# Patient Record
Sex: Male | Born: 2000 | Race: Black or African American | Hispanic: No | Marital: Single | Smoking: Never smoker
Health system: Southern US, Community
[De-identification: ages and names within clinical notes are randomized; demographics above are authoritative.]

## PROBLEM LIST (undated history)

## (undated) DIAGNOSIS — R947 Abnormal results of other endocrine function studies: Secondary | ICD-10-CM

---
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
## 2023-01-02 ENCOUNTER — Encounter (HOSPITAL_COMMUNITY): Payer: Self-pay | Admitting: *Deleted

## 2023-01-02 ENCOUNTER — Emergency Department (HOSPITAL_COMMUNITY)
Admission: EM | Admit: 2023-01-02 | Discharge: 2023-01-02 | Disposition: A | Discharge status: Home / Self Care | Payer: Medicaid Other | Attending: Emergency Medicine | Admitting: Emergency Medicine

## 2023-01-02 ENCOUNTER — Emergency Department (HOSPITAL_COMMUNITY): Payer: Medicaid Other

## 2023-01-02 ENCOUNTER — Other Ambulatory Visit: Payer: Self-pay

## 2023-01-02 DIAGNOSIS — Y92410 Unspecified street and highway as the place of occurrence of the external cause: Secondary | ICD-10-CM | POA: Insufficient documentation

## 2023-01-02 DIAGNOSIS — M25511 Pain in right shoulder: Secondary | ICD-10-CM | POA: Diagnosis not present

## 2023-01-02 DIAGNOSIS — M545 Low back pain, unspecified: Secondary | ICD-10-CM | POA: Diagnosis present

## 2023-01-02 DIAGNOSIS — M25561 Pain in right knee: Secondary | ICD-10-CM | POA: Insufficient documentation

## 2023-01-02 DIAGNOSIS — M25562 Pain in left knee: Secondary | ICD-10-CM | POA: Insufficient documentation

## 2023-01-02 HISTORY — DX: Abnormal results of other endocrine function studies: R94.7

## 2023-01-02 MED ORDER — METHOCARBAMOL 500 MG PO TABS
500.0000 mg | ORAL_TABLET | Freq: Once | ORAL | Status: AC
Start: 2023-01-02 — End: 2023-01-02
  Administered 2023-01-02: 500 mg via ORAL
  Filled 2023-01-02: qty 1

## 2023-01-02 MED ORDER — KETOROLAC TROMETHAMINE 15 MG/ML IJ SOLN
15.0000 mg | Freq: Once | INTRAMUSCULAR | Status: AC
  Administered 2023-01-02: 15 mg via INTRAVENOUS
  Filled 2023-01-02: qty 1
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
## 2023-01-02 NOTE — Discharge Instructions (Signed)
Your x-ray imaging studies were reassuring.  You will likely have worsened soreness tomorrow.  Take ibuprofen and Tylenol as needed.  Return to the emergency department for any new or worsening symptoms of concern.

## 2023-01-02 NOTE — ED Notes (Signed)
THE PT WENT TO TRIAGE BEFORE I HAD A CHANCE TO TRIAGE HIM

## 2023-01-04 ENCOUNTER — Other Ambulatory Visit: Payer: Self-pay

## 2023-01-04 ENCOUNTER — Emergency Department (HOSPITAL_COMMUNITY)
Admission: EM | Admit: 2023-01-04 | Discharge: 2023-01-04 | Disposition: A | Discharge status: Home / Self Care | Payer: Medicaid Other | Attending: Emergency Medicine | Admitting: Emergency Medicine

## 2023-01-04 ENCOUNTER — Other Ambulatory Visit (HOSPITAL_COMMUNITY): Payer: Self-pay

## 2023-01-04 DIAGNOSIS — R000 Tachycardia, unspecified: Secondary | ICD-10-CM | POA: Insufficient documentation

## 2023-01-04 DIAGNOSIS — R112 Nausea with vomiting, unspecified: Secondary | ICD-10-CM | POA: Diagnosis present

## 2023-01-04 DIAGNOSIS — R197 Diarrhea, unspecified: Secondary | ICD-10-CM | POA: Diagnosis not present

## 2023-01-04 DIAGNOSIS — N179 Acute kidney failure, unspecified: Secondary | ICD-10-CM | POA: Diagnosis not present

## 2023-01-04 LAB — COMPREHENSIVE METABOLIC PANEL
ALT: 20 U/L (ref 0–44)
AST: 31 U/L (ref 15–41)
Albumin: 4 g/dL (ref 3.5–5.0)
Alkaline Phosphatase: 44 U/L (ref 38–126)
Anion gap: 10 (ref 5–15)
BUN: 21 mg/dL — ABNORMAL HIGH (ref 6–20)
CO2: 25 mmol/L (ref 22–32)
Calcium: 9.2 mg/dL (ref 8.9–10.3)
Chloride: 101 mmol/L (ref 98–111)
Creatinine, Ser: 1.31 mg/dL — ABNORMAL HIGH (ref 0.61–1.24)
GFR, Estimated: >60 mL/min (ref >60)
Glucose, Bld: 111 mg/dL — ABNORMAL HIGH (ref 70–99)
Potassium: 3.8 mmol/L (ref 3.5–5.1)
Sodium: 136 mmol/L (ref 135–145)
Total Bilirubin: 2.1 mg/dL — ABNORMAL HIGH (ref 0.3–1.2)
Total Protein: 6.8 g/dL (ref 6.5–8.1)

## 2023-01-04 LAB — URINALYSIS, ROUTINE W REFLEX MICROSCOPIC
Bacteria, UA: NONE SEEN
Bilirubin Urine: NEGATIVE
Glucose, UA: NEGATIVE mg/dL
Hgb urine dipstick: NEGATIVE
Ketones, ur: NEGATIVE mg/dL
Leukocytes,Ua: NEGATIVE
Nitrite: NEGATIVE
Protein, ur: 30 mg/dL — AB
Specific Gravity, Urine: 1.032 — ABNORMAL HIGH (ref 1.005–1.030)
pH: 5 (ref 5.0–8.0)

## 2023-01-04 LAB — CBC WITH DIFFERENTIAL/PLATELET
Abs Immature Granulocytes: 0.03 10*3/uL (ref 0.00–0.07)
Basophils Absolute: 0 10*3/uL (ref 0.0–0.1)
Basophils Relative: 0 %
Eosinophils Absolute: 0 10*3/uL (ref 0.0–0.5)
Eosinophils Relative: 0 %
HCT: 42.8 % (ref 39.0–52.0)
Hemoglobin: 14.7 g/dL (ref 13.0–17.0)
Immature Granulocytes: 0 %
Lymphocytes Relative: 8 %
Lymphs Abs: 0.9 10*3/uL (ref 0.7–4.0)
MCH: 32.7 pg (ref 26.0–34.0)
MCHC: 34.3 g/dL (ref 30.0–36.0)
MCV: 95.1 fL (ref 80.0–100.0)
Monocytes Absolute: 1 10*3/uL (ref 0.1–1.0)
Monocytes Relative: 9 %
Neutro Abs: 9.4 10*3/uL — ABNORMAL HIGH (ref 1.7–7.7)
Neutrophils Relative %: 83 %
Platelets: 318 10*3/uL (ref 150–400)
RBC: 4.5 MIL/uL (ref 4.22–5.81)
RDW: 11.6 % (ref 11.5–15.5)
WBC: 11.4 10*3/uL — ABNORMAL HIGH (ref 4.0–10.5)
nRBC: 0 % (ref 0.0–0.2)

## 2023-01-04 LAB — LACTIC ACID, PLASMA: Lactic Acid, Venous: 1.9 mmol/L (ref 0.5–1.9)

## 2023-01-04 MED ORDER — ONDANSETRON HCL 4 MG PO TABS
4.0000 mg | ORAL_TABLET | Freq: Three times a day (TID) | ORAL | 0 refills | Status: DC | PRN
  Filled 2023-01-04: qty 12, 4d supply, fill #0

## 2023-01-04 MED ORDER — ONDANSETRON HCL 4 MG/2ML IJ SOLN
4.0000 mg | Freq: Once | INTRAMUSCULAR | Status: AC
  Administered 2023-01-04: 4 mg via INTRAVENOUS
  Filled 2023-01-04: qty 2

## 2023-01-04 MED ORDER — SODIUM CHLORIDE 0.9 % IV BOLUS
1000.0000 mL | Freq: Once | INTRAVENOUS | Status: AC
  Administered 2023-01-04: 1000 mL via INTRAVENOUS

## 2023-01-04 MED ORDER — ONDANSETRON HCL 4 MG PO TABS: 4.0000 mg | ORAL_TABLET | Freq: Three times a day (TID) | ORAL | 0 refills | Status: AC | PRN

## 2023-01-04 MED ORDER — ACETAMINOPHEN 500 MG PO TABS
1000.0000 mg | ORAL_TABLET | Freq: Once | ORAL | Status: AC
  Administered 2023-01-04: 1000 mg via ORAL
  Filled 2023-01-04: qty 2

## 2023-01-04 NOTE — Discharge Instructions (Addendum)
You have been evaluated for your symptoms.  You are likely experiencing a viral gastroenteritis.  Take Zofran as needed for nausea.  Drink plenty of fluid.  Take over-the-counter Tylenol or ibuprofen as needed for fever or bodyaches.  If you develop abdominal pain, persistent vomiting, or if you have any other concerns do not hesitate to return to ER for reassessment.
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
